# Patient Record
Sex: Female | Born: 1980 | Race: White | Hispanic: No | Marital: Married | Smoking: Never smoker
Health system: Southern US, Community
[De-identification: ages and names within clinical notes are randomized; demographics above are authoritative.]

## PROBLEM LIST (undated history)

## (undated) DIAGNOSIS — R87619 Unspecified abnormal cytological findings in specimens from cervix uteri: Secondary | ICD-10-CM

## (undated) HISTORY — PX: WISDOM TOOTH EXTRACTION: SHX21

## (undated) HISTORY — DX: Unspecified abnormal cytological findings in specimens from cervix uteri: R87.619

---

## 2014-03-17 ENCOUNTER — Ambulatory Visit (INDEPENDENT_AMBULATORY_CARE_PROVIDER_SITE_OTHER): Payer: BC Managed Care – PPO | Admitting: Physician Assistant

## 2014-03-17 ENCOUNTER — Encounter: Payer: Self-pay | Admitting: Physician Assistant

## 2014-03-17 ENCOUNTER — Other Ambulatory Visit: Payer: Self-pay | Admitting: Physician Assistant

## 2014-03-17 VITALS — BP 118/67 | HR 61 | Wt 128.0 lb

## 2014-03-17 DIAGNOSIS — R001 Bradycardia, unspecified: Secondary | ICD-10-CM

## 2014-03-17 DIAGNOSIS — R5383 Other fatigue: Secondary | ICD-10-CM | POA: Diagnosis not present

## 2014-03-17 DIAGNOSIS — R42 Dizziness and giddiness: Secondary | ICD-10-CM

## 2014-03-18 LAB — CBC WITH DIFFERENTIAL/PLATELET
BASOS ABS: 0.1 10*3/uL (ref 0.0–0.1)
BASOS PCT: 1 % (ref 0–1)
EOS ABS: 0.1 10*3/uL (ref 0.0–0.7)
Eosinophils Relative: 1 % (ref 0–5)
HCT: 40.8 % (ref 36.0–46.0)
Hemoglobin: 14.1 g/dL (ref 12.0–15.0)
LYMPHS ABS: 2.2 10*3/uL (ref 0.7–4.0)
LYMPHS PCT: 33 % (ref 12–46)
MCH: 29.6 pg (ref 26.0–34.0)
MCHC: 34.6 g/dL (ref 30.0–36.0)
MCV: 85.5 fL (ref 78.0–100.0)
MPV: 8.9 fL — AB (ref 9.4–12.4)
Monocytes Absolute: 0.6 10*3/uL (ref 0.1–1.0)
Monocytes Relative: 9 % (ref 3–12)
NEUTROS PCT: 56 % (ref 43–77)
Neutro Abs: 3.7 10*3/uL (ref 1.7–7.7)
PLATELETS: 388 10*3/uL (ref 150–400)
RBC: 4.77 MIL/uL (ref 3.87–5.11)
RDW: 13.5 % (ref 11.5–15.5)
WBC: 6.6 10*3/uL (ref 4.0–10.5)

## 2014-03-18 LAB — COMPLETE METABOLIC PANEL WITHOUT GFR
ALT: 16 U/L (ref 0–35)
AST: 19 U/L (ref 0–37)
Albumin: 4.7 g/dL (ref 3.5–5.2)
Alkaline Phosphatase: 55 U/L (ref 39–117)
BUN: 9 mg/dL (ref 6–23)
CO2: 30 meq/L (ref 19–32)
Calcium: 9.3 mg/dL (ref 8.4–10.5)
Chloride: 95 meq/L — ABNORMAL LOW (ref 96–112)
Creat: 0.64 mg/dL (ref 0.50–1.10)
GFR, Est African American: >89 mL/min
GFR, Est Non African American: >89 mL/min
Glucose, Bld: 103 mg/dL — ABNORMAL HIGH (ref 70–99)
Potassium: 4.3 meq/L (ref 3.5–5.3)
Sodium: 132 meq/L — ABNORMAL LOW (ref 135–145)
Total Bilirubin: 0.4 mg/dL (ref 0.2–1.2)
Total Protein: 7.2 g/dL (ref 6.0–8.3)

## 2014-03-18 LAB — FERRITIN: FERRITIN: 53 ng/mL (ref 10–291)

## 2014-03-18 LAB — IRON AND TIBC
%SAT: 32 % (ref 20–55)
Iron: 105 ug/dL (ref 42–145)
TIBC: 324 ug/dL (ref 250–470)
UIBC: 219 ug/dL (ref 125–400)

## 2014-03-18 LAB — VITAMIN B12: Vitamin B-12: 740 pg/mL (ref 211–911)

## 2014-03-18 LAB — VITAMIN D 25 HYDROXY (VIT D DEFICIENCY, FRACTURES): Vit D, 25-Hydroxy: 36 ng/mL (ref 30–100)

## 2014-03-18 LAB — TSH: TSH: 0.973 u[IU]/mL (ref 0.350–4.500)

## 2014-03-18 LAB — T4, FREE: Free T4: 1.19 ng/dL (ref 0.80–1.80)

## 2014-03-22 ENCOUNTER — Telehealth: Payer: Self-pay | Admitting: Physician Assistant

## 2014-03-22 DIAGNOSIS — R001 Bradycardia, unspecified: Secondary | ICD-10-CM | POA: Insufficient documentation

## 2014-03-22 NOTE — Progress Notes (Signed)
   Subjective:    Patient ID: Donna RoeBonnie Brooks, female    DOB: [birth year], 33 y.o.   MRN: [id number]  HPI Patient is a 33 year old female who presents to the clinic to establish care.  Patient has a past medical history of bradycardia that was diagnosed by EKG in 2014. She has not been symptomatic until a few months ago. She is more tired, lightheadedness, some nausea and occasional ankle swelling.  .. Family History  Problem Relation Age of Onset  . Alcohol abuse Mother   . Hyperlipidemia Maternal Aunt   . Cancer Maternal Uncle     lung  . Hyperlipidemia Maternal Uncle   . Cancer Maternal Grandmother     colon  . Hyperlipidemia Maternal Grandmother   . Hypertension Maternal Grandmother   . Alcohol abuse Maternal Grandfather   . Cancer Maternal Grandfather     pancreatic  . Diabetes Maternal Grandfather   . Hyperlipidemia Maternal Grandfather   . Hypertension Maternal Grandfather   . Hyperlipidemia Maternal Aunt    .Marland Kitchen. History   Social History  . Marital Status: Married    Spouse Name: N/A    Number of Children: N/A  . Years of Education: N/A   Occupational History  . Not on file.   Social History Main Topics  . Smoking status: Never Smoker   . Smokeless tobacco: Not on file  . Alcohol Use: 0.0 oz/week    0 Not specified per week  . Drug Use: No  . Sexual Activity: Yes   Other Topics Concern  . Not on file   Social History Narrative  . No narrative on file      Review of Systems  All other systems reviewed and are negative.      Objective:   Physical Exam  Constitutional: She is oriented to person, place, and time. She appears well-developed and well-nourished.  HENT:  Head: Normocephalic and atraumatic.  Cardiovascular: Normal rate, regular rhythm and normal heart sounds.   Pulmonary/Chest: Effort normal and breath sounds normal. She has no wheezes.  Neurological: She is alert and oriented to person, place, and time.  Skin: Skin is dry.   Psychiatric: She has a normal mood and affect. Her behavior is normal.          Assessment & Plan:  Bradycardia/lightheadness/fatigue- EKG-sinus bradycardia at 53. No ST elevation or depression. No arrhythmias.   will get labs to do full work up if negative then consider cardiology. Discussed with patient bradycardia treated only if symptomatic does seem like pt has become more symptomatic. If continues to be symptomatic will send to cardiology.

## 2014-04-08 ENCOUNTER — Telehealth: Payer: Self-pay | Admitting: *Deleted

## 2014-04-08 DIAGNOSIS — E871 Hypo-osmolality and hyponatremia: Secondary | ICD-10-CM

## 2014-04-08 LAB — COMPLETE METABOLIC PANEL WITH GFR
ALBUMIN: 4.7 g/dL (ref 3.5–5.2)
ALT: 19 U/L (ref 0–35)
AST: 18 U/L (ref 0–37)
Alkaline Phosphatase: 44 U/L (ref 39–117)
BUN: 6 mg/dL (ref 6–23)
CALCIUM: 9.1 mg/dL (ref 8.4–10.5)
CHLORIDE: 97 meq/L (ref 96–112)
CO2: 25 mEq/L (ref 19–32)
Creat: 0.66 mg/dL (ref 0.50–1.10)
GFR, Est African American: >89 mL/min
GLUCOSE: 85 mg/dL (ref 70–99)
POTASSIUM: 4.3 meq/L (ref 3.5–5.3)
Sodium: 131 mEq/L — ABNORMAL LOW (ref 135–145)
TOTAL PROTEIN: 7.1 g/dL (ref 6.0–8.3)
Total Bilirubin: 0.6 mg/dL (ref 0.2–1.2)

## 2014-04-08 NOTE — Telephone Encounter (Signed)
CMP ordered for low sodium level recheck.

## 2014-04-13 NOTE — Addendum Note (Signed)
Addended by: Chalmers CaterUTTLE, Danika Kluender H on: 04/13/2014 07:35 AM   Modules accepted: Orders

## 2014-04-19 ENCOUNTER — Telehealth: Payer: Self-pay | Admitting: *Deleted

## 2014-04-19 DIAGNOSIS — R7989 Other specified abnormal findings of blood chemistry: Secondary | ICD-10-CM

## 2014-04-19 NOTE — Telephone Encounter (Signed)
Labs ordered & faxed to lab.  Pt notified. 

## 2014-06-08 NOTE — Telephone Encounter (Signed)
Error needs closed.

## 2014-07-13 ENCOUNTER — Encounter: Payer: Self-pay | Admitting: Physician Assistant

## 2014-07-13 ENCOUNTER — Ambulatory Visit (INDEPENDENT_AMBULATORY_CARE_PROVIDER_SITE_OTHER): Payer: BLUE CROSS/BLUE SHIELD

## 2014-07-13 ENCOUNTER — Ambulatory Visit (INDEPENDENT_AMBULATORY_CARE_PROVIDER_SITE_OTHER): Payer: BLUE CROSS/BLUE SHIELD | Admitting: Physician Assistant

## 2014-07-13 VITALS — BP 133/71 | HR 55 | Wt 129.0 lb

## 2014-07-13 DIAGNOSIS — R079 Chest pain, unspecified: Secondary | ICD-10-CM | POA: Diagnosis not present

## 2014-07-13 DIAGNOSIS — R071 Chest pain on breathing: Secondary | ICD-10-CM

## 2014-07-13 DIAGNOSIS — M546 Pain in thoracic spine: Secondary | ICD-10-CM | POA: Diagnosis not present

## 2014-07-13 DIAGNOSIS — M549 Dorsalgia, unspecified: Secondary | ICD-10-CM

## 2014-07-13 MED ORDER — MELOXICAM 15 MG PO TABS: 15.0000 mg | ORAL_TABLET | Freq: Every day | ORAL | Status: DC

## 2014-07-13 MED ORDER — DICLOFENAC SODIUM 2 % TD SOLN: TRANSDERMAL | Status: DC

## 2014-07-17 NOTE — Progress Notes (Signed)
   Subjective:    Patient ID: Donna Brooks, female    DOB: 1980-08-20, 34 y.o.   MRN: [id number]  HPI  Pt is a 34 yo female who presents to the clinic with 2 weeks of right shoulder blade pain. She admits to working out but no known injury. She is lifting some weights with upper body. Pain started 2 weeks ago and seems to be getting worse. Right shoulder does not hurt. No palpitations. Pain in right back radiates into her chest at times which concerns her. No cough, SOB or wheezing. No fever or chills. Laid off on working out and taken some ibuprofen with no real relief. She has been to chiropracter with little relief as well.     Review of Systems  All other systems reviewed and are negative.      Objective:   Physical Exam  Constitutional: She appears well-developed and well-nourished.  Cardiovascular: Normal rate, regular rhythm and normal heart sounds.   Pulmonary/Chest: Effort normal and breath sounds normal.  Musculoskeletal:  NROM of right shoulder.  No bony tenderness.  Strength of right upper extremity 5/5.  Tenderness/tightness over supraspinatus and infraspinatus of right shoulder.  Some tightness with external and internal rotation.           Assessment & Plan:  Right upper back pain/chest pain with breathing-since some pain with breathing will get CXR. Seems like some muscular strain/impingement of some sort. Given samples of pennsaid bid and mobic once daily. Discussed side effects. Ice area. Exercises were given to start. Stop upper body weight lifting for 2 weeks. If not improving please call office.

## 2014-11-12 ENCOUNTER — Ambulatory Visit (INDEPENDENT_AMBULATORY_CARE_PROVIDER_SITE_OTHER): Payer: BLUE CROSS/BLUE SHIELD

## 2014-11-12 ENCOUNTER — Encounter: Payer: Self-pay | Admitting: Sports Medicine

## 2014-11-12 ENCOUNTER — Ambulatory Visit (INDEPENDENT_AMBULATORY_CARE_PROVIDER_SITE_OTHER): Payer: BLUE CROSS/BLUE SHIELD | Admitting: Sports Medicine

## 2014-11-12 VITALS — BP 123/80 | HR 48 | Ht 65.5 in | Wt 130.0 lb

## 2014-11-12 DIAGNOSIS — S134XXA Sprain of ligaments of cervical spine, initial encounter: Secondary | ICD-10-CM | POA: Diagnosis not present

## 2014-11-12 DIAGNOSIS — M542 Cervicalgia: Secondary | ICD-10-CM

## 2014-11-12 DIAGNOSIS — L989 Disorder of the skin and subcutaneous tissue, unspecified: Secondary | ICD-10-CM | POA: Insufficient documentation

## 2014-11-12 NOTE — Assessment & Plan Note (Signed)
Cryo-destruction of 5 skin lesions, skin tags.
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
## 2014-11-26 ENCOUNTER — Ambulatory Visit (INDEPENDENT_AMBULATORY_CARE_PROVIDER_SITE_OTHER): Payer: BLUE CROSS/BLUE SHIELD | Admitting: Sports Medicine

## 2014-11-26 ENCOUNTER — Encounter: Payer: Self-pay | Admitting: Sports Medicine

## 2014-11-26 VITALS — BP 122/76 | HR 53 | Ht 65.5 in | Wt 134.0 lb

## 2014-11-26 DIAGNOSIS — L989 Disorder of the skin and subcutaneous tissue, unspecified: Secondary | ICD-10-CM

## 2014-11-26 DIAGNOSIS — L603 Nail dystrophy: Secondary | ICD-10-CM | POA: Diagnosis not present

## 2014-11-26 DIAGNOSIS — S134XXD Sprain of ligaments of cervical spine, subsequent encounter: Secondary | ICD-10-CM

## 2014-11-26 MED ORDER — PREDNISONE 50 MG PO TABS: ORAL_TABLET | ORAL | Status: DC

## 2014-11-26 NOTE — Assessment & Plan Note (Signed)
At this point think she simply needs to let the nail grow out, certainly could remove it and treat with phenol, however I think the tincture of time is ideal here.
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
## 2014-11-26 NOTE — Assessment & Plan Note (Signed)
Previous skin tags look fantastic. I did have to perform cryotherapy recurrent on two of the skin tags.

## 2015-01-10 ENCOUNTER — Other Ambulatory Visit: Payer: Self-pay | Admitting: Unknown Physician Specialty

## 2015-01-10 ENCOUNTER — Ambulatory Visit
Admission: RE | Admit: 2015-01-10 | Discharge: 2015-01-10 | Disposition: A | Discharge status: Home / Self Care | Payer: BLUE CROSS/BLUE SHIELD | Source: Ambulatory Visit | Attending: Unknown Physician Specialty | Admitting: Unknown Physician Specialty

## 2015-01-10 DIAGNOSIS — T1490XA Injury, unspecified, initial encounter: Secondary | ICD-10-CM

## 2015-06-23 ENCOUNTER — Encounter: Payer: Self-pay | Admitting: Obstetrics & Gynecology

## 2015-06-23 ENCOUNTER — Ambulatory Visit (INDEPENDENT_AMBULATORY_CARE_PROVIDER_SITE_OTHER): Payer: BLUE CROSS/BLUE SHIELD | Admitting: Obstetrics & Gynecology

## 2015-06-23 VITALS — BP 115/68 | HR 54 | Resp 16 | Ht 65.5 in | Wt 130.0 lb

## 2015-06-23 DIAGNOSIS — Z1151 Encounter for screening for human papillomavirus (HPV): Secondary | ICD-10-CM

## 2015-06-23 DIAGNOSIS — Z01419 Encounter for gynecological examination (general) (routine) without abnormal findings: Secondary | ICD-10-CM | POA: Diagnosis not present

## 2015-06-23 DIAGNOSIS — Z124 Encounter for screening for malignant neoplasm of cervix: Secondary | ICD-10-CM | POA: Diagnosis not present

## 2015-06-23 NOTE — Progress Notes (Signed)
  Subjective:     Donna Brooks is a 35 y.o. female here for a routine exam.  Current complaints: none.  Has anxiety and undergoing CBT.  Not interested in medication  Gynecologic History Patient's last menstrual period was 05/31/2015. Contraception: vasectomy Last Pap: 2015. Results were: normal per patient   Obstetric History OB History  Gravida Para Term Preterm AB SAB TAB Ectopic Multiple Living  2 2 2       2     # Outcome Date GA Lbr Len/2nd Weight Sex Delivery Anes PTL Lv  2 Term      Vag-Spont     1 Term      Vag-Spont        The following portions of the patient's history were reviewed and updated as appropriate: allergies, current medications, past family history, past medical history, past social history, past surgical history and problem list.  Review of Systems Pertinent items noted in HPI and remainder of comprehensive ROS otherwise negative.    Objective:      Filed Vitals:   06/23/15 1031  BP: 115/68  Pulse: 54  Resp: 16  Height: 5' 5.5" (1.664 m)  Weight: 130 lb (58.968 kg)   Vitals:  WNL General appearance: alert, cooperative and no distress  HEENT: Normocephalic, without obvious abnormality, atraumatic Eyes: negative Throat: lips, mucosa, and tongue normal; teeth and gums normal  Respiratory: Clear to auscultation bilaterally  CV: Regular rate and rhythm  Breasts:  Normal appearance, no masses or tenderness, no nipple retraction or dimpling  GI: Soft, non-tender; bowel sounds normal; no masses,  no organomegaly  GU: External Genitalia:  Tanner V, no lesion Urethra:  No prolapse   Vagina: Pink, normal rugae, no blood or discharge  Cervix: No CMT, no lesion  Uterus:  Normal size and contour, non tender  Adnexa: Normal, no masses, non tender  Musculoskeletal: No edema, redness or tenderness in the calves or thighs  Skin: No lesions or rash  Lymphatic: Axillary adenopathy: none    Psychiatric: Normal mood and behavior    Assessment:    Healthy female exam.    Plan:    Contraception: vasectomy. Follow up in: 1 year.    Continue CBT and see PC if decides to have mads.

## 2015-06-29 LAB — CYTOLOGY - PAP

## 2016-04-07 ENCOUNTER — Emergency Department (INDEPENDENT_AMBULATORY_CARE_PROVIDER_SITE_OTHER)
Admission: EM | Admit: 2016-04-07 | Discharge: 2016-04-07 | Disposition: A | Discharge status: Home / Self Care | Payer: Managed Care, Other (non HMO) | Source: Home / Self Care | Attending: Family Medicine | Admitting: Family Medicine

## 2016-04-07 ENCOUNTER — Encounter: Payer: Self-pay | Admitting: Emergency Medicine

## 2016-04-07 DIAGNOSIS — R51 Headache: Secondary | ICD-10-CM

## 2016-04-07 DIAGNOSIS — R519 Headache, unspecified: Secondary | ICD-10-CM

## 2016-04-07 DIAGNOSIS — R5383 Other fatigue: Secondary | ICD-10-CM

## 2016-04-07 DIAGNOSIS — R0981 Nasal congestion: Secondary | ICD-10-CM

## 2016-04-07 DIAGNOSIS — R52 Pain, unspecified: Secondary | ICD-10-CM

## 2016-04-07 MED ORDER — AMOXICILLIN-POT CLAVULANATE 875-125 MG PO TABS: 1.0000 | ORAL_TABLET | Freq: Two times a day (BID) | ORAL | 0 refills | Status: DC

## 2016-04-07 NOTE — ED Provider Notes (Signed)
CSN: [id number]     Arrival date & time 04/07/16  1733 History   First MD Initiated Contact with Patient 04/07/16 1826     Chief Complaint  Patient presents with  . Headache  . Generalized Body Aches   (Consider location/radiation/quality/duration/timing/severity/associated sxs/prior Treatment) HPI Donna Brooks is a 35 y.o. female presenting to UC with c/o generalized headache, body aches for 4 weeks and chest tightness and congestion that has worsened over the last 1-2 weeks.  She does not know why she has not been getting better as she notes her cough is minimal but her headache, body aches and fatigue have persisted. Denies sore throat. Subjective fever in the beginning but not over the last 1 week.  Denies SOB.  Denies n/v/d. No known sick contacts or recent travel. Denies hx of mono.    Past Medical History:  Diagnosis Date  . Abnormal Pap smear of cervix    colpo   Past Surgical History:  Procedure Laterality Date  . WISDOM TOOTH EXTRACTION     Family History  Problem Relation Age of Onset  . Alcohol abuse Mother   . Hyperlipidemia Maternal Aunt   . Cancer Maternal Uncle     lung  . Hyperlipidemia Maternal Uncle   . Cancer Maternal Grandmother     colon  . Hyperlipidemia Maternal Grandmother   . Hypertension Maternal Grandmother   . Alcohol abuse Maternal Grandfather   . Cancer Maternal Grandfather     pancreatic  . Diabetes Maternal Grandfather   . Hyperlipidemia Maternal Grandfather   . Hypertension Maternal Grandfather   . Hyperlipidemia Maternal Aunt    Social History  Substance Use Topics  . Smoking status: Never Smoker  . Smokeless tobacco: Never Used  . Alcohol use 0.0 oz/week   OB History    Gravida Para Term Preterm AB Living   2 2 2     2    SAB TAB Ectopic Multiple Live Births                 Review of Systems  Constitutional: Positive for fatigue. Negative for chills and fever.  HENT: Positive for congestion. Negative for ear pain, sore  throat, trouble swallowing and voice change.   Respiratory: Positive for cough. Negative for shortness of breath.   Cardiovascular: Negative for chest pain and palpitations.  Gastrointestinal: Negative for abdominal pain, diarrhea, nausea and vomiting.  Musculoskeletal: Positive for arthralgias and myalgias. Negative for back pain.       Body aches  Skin: Negative for rash.  Neurological: Positive for headaches. Negative for dizziness and light-headedness.    Allergies  Zithromax [azithromycin]  Home Medications   Prior to Admission medications   Medication Sig Start Date End Date Taking? Authorizing Provider  amoxicillin-clavulanate (AUGMENTIN) 875-125 MG tablet Take 1 tablet by mouth 2 (two) times daily. One po bid x 7 days 04/07/16   Junius FinnerErin O'Malley, PA-C  Multiple Vitamin (MULTIVITAMIN) tablet Take 1 tablet by mouth daily.    Historical Provider, MD   Meds Ordered and Administered this Visit  Medications - No data to display  BP 124/78 (BP Location: Left Arm)   Pulse (!) 58   Temp 98.2 F (36.8 C) (Oral)   Resp 16   Ht 5' 5.5" (1.664 m)   Wt 135 lb (61.2 kg)   LMP 03/12/2016   SpO2 100%   BMI 22.12 kg/m  No data found.   Physical Exam  Constitutional: She appears well-developed and well-nourished. No distress.  HENT:  Head: Normocephalic and atraumatic.  Right Ear: Tympanic membrane normal.  Left Ear: Tympanic membrane normal.  Nose: Right sinus exhibits no maxillary sinus tenderness and no frontal sinus tenderness. Left sinus exhibits frontal sinus tenderness. Left sinus exhibits no maxillary sinus tenderness.  Mouth/Throat: Uvula is midline, oropharynx is clear and moist and mucous membranes are normal.  Eyes: Conjunctivae are normal. No scleral icterus.  Neck: Normal range of motion. Neck supple.  Cardiovascular: Normal rate, regular rhythm and normal heart sounds.   Pulmonary/Chest: Effort normal and breath sounds normal. No stridor. No respiratory distress. She  has no wheezes. She has no rales.  Abdominal: Soft. She exhibits no distension. There is no tenderness.  Musculoskeletal: Normal range of motion.  Lymphadenopathy:    She has no cervical adenopathy.  Neurological: She is alert.  Skin: Skin is warm and dry. She is not diaphoretic.  Nursing note and vitals reviewed.   Urgent Care Course   Clinical Course     Procedures (including critical care time)  Labs Review Labs Reviewed - No data to display  Imaging Review No results found.   MDM   1. Generalized headache   2. Fatigue, unspecified type   3. Body aches   4. Head congestion    Pt c/o 1 month of congestion and body aches. She has not been seen by a medical provider for her symptoms.   Will cover for bacterial cause. Pt is allergic to azithromycin Discussed testing for mono, pt declined understanding mono is a viral illness that needs to run its course.   Rx: Augmentin  Encouraged f/u with PCP in 1-2 weeks if not improving as fatigue may need further evaluation.    Junius FinnerErin O'Malley, PA-C 04/07/16 [phone]

## 2016-04-07 NOTE — ED Triage Notes (Signed)
Has been ill entire past 4 weeks with headaches, general aches, tightness in chest and congestion prominent past 1-2 weeks. Does not know why she is not improving.

## 2016-04-10 ENCOUNTER — Telehealth: Payer: Self-pay

## 2016-04-10 NOTE — Telephone Encounter (Signed)
Left message for patient to follow up with UC or PCP if questions or concerns.

## 2016-04-13 ENCOUNTER — Ambulatory Visit: Payer: BLUE CROSS/BLUE SHIELD | Admitting: Physician Assistant
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
## 2017-02-05 ENCOUNTER — Ambulatory Visit (INDEPENDENT_AMBULATORY_CARE_PROVIDER_SITE_OTHER): Payer: Managed Care, Other (non HMO) | Admitting: Obstetrics & Gynecology

## 2017-02-05 ENCOUNTER — Encounter: Payer: Self-pay | Admitting: Obstetrics & Gynecology

## 2017-02-05 VITALS — BP 118/72 | HR 54 | Ht 65.0 in | Wt 133.0 lb

## 2017-02-05 DIAGNOSIS — L709 Acne, unspecified: Secondary | ICD-10-CM

## 2017-02-05 DIAGNOSIS — Z01419 Encounter for gynecological examination (general) (routine) without abnormal findings: Secondary | ICD-10-CM | POA: Diagnosis not present

## 2017-02-05 NOTE — Progress Notes (Signed)
Subjective:     Donna RoeBonnie Goller is a 36 y.o. female here for a routine exam.  Current complaints: wants skin lesions on chest evaluated.  Personal health questionnaire reviewed: not asked.   Gynecologic History No LMP recorded (lmp unknown). Contraception: vasectomy Last Pap: 2017. Results were: normal Last mammogram: n/a.   Obstetric History OB History  Gravida Para Term Preterm AB Living  2 2 2     2   SAB TAB Ectopic Multiple Live Births               # Outcome Date GA Lbr Len/2nd Weight Sex Delivery Anes PTL Lv  2 Term      Vag-Spont     1 Term      Vag-Spont          The following portions of the patient's history were reviewed and updated as appropriate: allergies, current medications, past family history, past medical history, past social history, past surgical history and problem list.  Review of Systems Pertinent items noted in HPI and remainder of comprehensive ROS otherwise negative.    Objective:      Vitals:   02/05/17 1505  BP: 118/72  Pulse: (!) 54  Weight: 133 lb (60.3 kg)  Height: 5\' 5"  (1.651 m)   Vitals:  WNL General appearance: alert, cooperative and no distress  HEENT: Normocephalic, without obvious abnormality, atraumatic Eyes: negative Throat: lips, mucosa, and tongue normal; teeth and gums normal  Respiratory: Clear to auscultation bilaterally  CV: Regular rate and rhythm  Breasts:  Normal appearance, no masses or tenderness, no nipple retraction or dimpling  GI: Soft, non-tender; bowel sounds normal; no masses,  no organomegaly  GU: External Genitalia:  Tanner V, no lesion Urethra:  No prolapse   Vagina: Pink, normal rugae, no blood or discharge  Cervix: No CMT, no lesion  Uterus:  Normal size and contour, non tender  Adnexa: Normal, no masses, non tender  Musculoskeletal: No edema, redness or tenderness in the calves or thighs  Skin: Sun damage on chest--suggest dermatology screening  Lymphatic: Axillary adenopathy: none      Psychiatric: Normal mood and behavior    Assessment:    Healthy female exam.    Plan:   Pap due in 2020 Mammogram at age 36 Dermatology skin screening.

## 2017-09-24 ENCOUNTER — Ambulatory Visit (INDEPENDENT_AMBULATORY_CARE_PROVIDER_SITE_OTHER): Payer: BLUE CROSS/BLUE SHIELD | Admitting: Physician Assistant

## 2017-09-24 ENCOUNTER — Encounter: Payer: Self-pay | Admitting: Physician Assistant

## 2017-09-24 VITALS — BP 128/83 | HR 60 | Temp 98.6°F | Resp 16 | Ht 65.5 in | Wt 134.0 lb

## 2017-09-24 DIAGNOSIS — Z13220 Encounter for screening for lipoid disorders: Secondary | ICD-10-CM

## 2017-09-24 DIAGNOSIS — Z0000 Encounter for general adult medical examination without abnormal findings: Secondary | ICD-10-CM

## 2017-09-24 DIAGNOSIS — Z131 Encounter for screening for diabetes mellitus: Secondary | ICD-10-CM

## 2017-09-24 NOTE — Progress Notes (Signed)
Subjective:     Donna RoeBonnie Brooks is a 37 y.o. female and is here for a comprehensive physical exam. The patient reports no problems.  Social History   Socioeconomic History  . Marital status: Married    Spouse name: Not on file  . Number of children: Not on file  . Years of education: Not on file  . Highest education level: Not on file  Occupational History  . Occupation: [employer] business  Social Needs  . Financial resource strain: Not on file  . Food insecurity:    Worry: Not on file    Inability: Not on file  . Transportation needs:    Medical: Not on file    Non-medical: Not on file  Tobacco Use  . Smoking status: Never Smoker  . Smokeless tobacco: Never Used  Substance and Sexual Activity  . Alcohol use: Yes    Alcohol/week: 0.0 oz  . Drug use: No  . Sexual activity: Yes    Partners: Male    Birth control/protection: Other-see comments    Comment: vasectomy  Lifestyle  . Physical activity:    Days per week: Not on file    Minutes per session: Not on file  . Stress: Not on file  Relationships  . Social connections:    Talks on phone: Not on file    Gets together: Not on file    Attends religious service: Not on file    Active member of club or organization: Not on file    Attends meetings of clubs or organizations: Not on file    Relationship status: Not on file  . Intimate partner violence:    Fear of current or ex partner: Not on file    Emotionally abused: Not on file    Physically abused: Not on file    Forced sexual activity: Not on file  Other Topics Concern  . Not on file  Social History Narrative  . Not on file   Health Maintenance  Topic Date Due  . HIV Screening  07/30/1994  . INFLUENZA VACCINE  11/07/2017  . PAP SMEAR  06/23/2018  . TETANUS/TDAP  04/09/2022    The following portions of the patient's history were reviewed and updated as appropriate: allergies, current medications, past family history, past medical history, past social history,  past surgical history and problem list.  Review of Systems A comprehensive review of systems was negative.   Objective:    BP 128/83   Pulse 60   Temp 98.6 F (37 C)   Resp 16   Ht 5' 5.5" (1.664 m)   Wt 134 lb (60.8 kg)   SpO2 100%   BMI 21.96 kg/m  General appearance: alert, cooperative and appears stated age Head: Normocephalic, without obvious abnormality, atraumatic Eyes: conjunctivae/corneas clear. PERRL, EOM's intact. Fundi benign. Ears: normal TM's and external ear canals both ears Nose: Nares normal. Septum midline. Mucosa normal. No drainage or sinus tenderness. Throat: lips, mucosa, and tongue normal; teeth and gums normal Neck: no adenopathy, no carotid bruit, no JVD, supple, symmetrical, trachea midline and thyroid not enlarged, symmetric, no tenderness/mass/nodules Back: symmetric, no curvature. ROM normal. No CVA tenderness. Lungs: clear to auscultation bilaterally Heart: regular rate and rhythm, S1, S2 normal, no murmur, click, rub or gallop Abdomen: soft, non-tender; bowel sounds normal; no masses,  no organomegaly Extremities: extremities normal, atraumatic, no cyanosis or edema Pulses: 2+ and symmetric Skin: Skin color, texture, turgor normal. No rashes or lesions Lymph nodes: Cervical, supraclavicular, and axillary nodes  normal. Neurologic: Alert and oriented X 3, normal strength and tone. Normal symmetric reflexes. Normal coordination and gait    Assessment:    Healthy female exam.      Plan:    Marland KitchenMarland KitchenAdine was seen today for employment physical and annual exam.  Diagnoses and all orders for this visit:  Routine physical examination -     Lipid Panel w/reflex Direct LDL -     COMPLETE METABOLIC PANEL WITH GFR -     Hemoglobin A1c  Screening for diabetes mellitus -     COMPLETE METABOLIC PANEL WITH GFR  Screening for lipid disorders -     Hemoglobin A1c   .Marland Kitchen Depression screen [facility] 2/9 09/24/2017  Decreased Interest 0  Down, Depressed, Hopeless  0  PHQ - 2 Score 0   Discussed 150 minutes of exercise a week.  Encouraged vitamin D 1000 units and Calcium 1300mg  or 4 servings of dairy a day.  Vaccines up to date.  Forms for work filled out. Ordered fasting labs.  Pap up to date normal 2017.   See After Visit Summary for Counseling Recommendations

## 2017-09-24 NOTE — Patient Instructions (Signed)

## 2017-09-25 LAB — COMPLETE METABOLIC PANEL WITH GFR
AG RATIO: 2.1 (calc) (ref 1.0–2.5)
ALKALINE PHOSPHATASE (APISO): 48 U/L (ref 33–115)
ALT: 14 U/L (ref 6–29)
AST: 14 U/L (ref 10–30)
Albumin: 4.9 g/dL (ref 3.6–5.1)
BILIRUBIN TOTAL: 0.4 mg/dL (ref 0.2–1.2)
BUN: 9 mg/dL (ref 7–25)
CALCIUM: 9.8 mg/dL (ref 8.6–10.2)
CHLORIDE: 102 mmol/L (ref 98–110)
CO2: 28 mmol/L (ref 20–32)
Creat: 0.72 mg/dL (ref 0.50–1.10)
GFR, EST NON AFRICAN AMERICAN: 106 mL/min/{1.73_m2} (ref >=60)
GFR, Est African American: 123 mL/min/{1.73_m2} (ref >=60)
GLOBULIN: 2.3 g/dL (ref 1.9–3.7)
Glucose, Bld: 85 mg/dL (ref 65–99)
Potassium: 4 mmol/L (ref 3.5–5.3)
SODIUM: 138 mmol/L (ref 135–146)
Total Protein: 7.2 g/dL (ref 6.1–8.1)

## 2017-09-25 LAB — HEMOGLOBIN A1C
Hgb A1c MFr Bld: 5.2 % of total Hgb (ref <5.7)
MEAN PLASMA GLUCOSE: 103 (calc)
eAG (mmol/L): 5.7 (calc)

## 2017-09-25 LAB — LIPID PANEL W/REFLEX DIRECT LDL
CHOL/HDL RATIO: 2.1 (calc) (ref <5.0)
CHOLESTEROL: 167 mg/dL (ref <200)
HDL: 80 mg/dL (ref >50)
LDL CHOLESTEROL (CALC): 72 mg/dL
Non-HDL Cholesterol (Calc): 87 mg/dL (calc) (ref <130)
Triglycerides: 74 mg/dL (ref <150)

## 2017-09-25 NOTE — Progress Notes (Signed)
Call pt: cholesterol looks fantastic. Kidney, liver, glucose look great. Pt may need exact numbers to plug into her work document if you call and [place] not [place]
